# Patient Record
Sex: Female | Born: 1970 | Race: Black or African American | Hispanic: No | Marital: Single | Smoking: Former smoker
Health system: Southern US, Community
[De-identification: ages and names within clinical notes are randomized; demographics above are authoritative.]

## PROBLEM LIST (undated history)

## (undated) DIAGNOSIS — E079 Disorder of thyroid, unspecified: Secondary | ICD-10-CM

## (undated) HISTORY — PX: THYROIDECTOMY: SHX17

---

## 2004-01-21 ENCOUNTER — Emergency Department: Payer: Self-pay | Admitting: Internal Medicine

## 2004-02-01 ENCOUNTER — Other Ambulatory Visit: Payer: Self-pay

## 2004-02-01 ENCOUNTER — Emergency Department: Payer: Self-pay | Admitting: Unknown Physician Specialty

## 2005-09-09 ENCOUNTER — Emergency Department: Payer: Self-pay | Admitting: Emergency Medicine

## 2006-03-11 ENCOUNTER — Emergency Department: Payer: Self-pay | Admitting: Emergency Medicine

## 2006-06-20 ENCOUNTER — Emergency Department: Payer: Self-pay | Admitting: Emergency Medicine

## 2009-08-16 ENCOUNTER — Emergency Department: Payer: Self-pay | Admitting: Emergency Medicine

## 2010-12-19 ENCOUNTER — Emergency Department: Payer: Self-pay | Admitting: *Deleted

## 2011-07-22 ENCOUNTER — Emergency Department: Payer: Self-pay | Admitting: Emergency Medicine

## 2012-05-13 ENCOUNTER — Observation Stay: Payer: Self-pay | Admitting: Obstetrics and Gynecology

## 2012-05-13 LAB — FETAL FIBRONECTIN
Appearance: NORMAL
Fetal Fibronectin: NEGATIVE

## 2012-05-13 LAB — URINALYSIS, COMPLETE
Bilirubin,UR: NEGATIVE
Glucose,UR: NEGATIVE mg/dL (ref 0–75)
Leukocyte Esterase: NEGATIVE
Specific Gravity: 1.003 (ref 1.003–1.030)
Squamous Epithelial: 1

## 2012-05-31 ENCOUNTER — Observation Stay: Payer: Self-pay

## 2012-07-26 ENCOUNTER — Observation Stay: Payer: Self-pay | Admitting: Obstetrics and Gynecology

## 2012-07-30 ENCOUNTER — Observation Stay: Payer: Self-pay | Admitting: Obstetrics and Gynecology

## 2012-07-30 LAB — PIH PROFILE
BUN: 3 mg/dL — ABNORMAL LOW (ref 7–18)
Chloride: 107 mmol/L (ref 98–107)
EGFR (African American): >60
Glucose: 98 mg/dL (ref 65–99)
HGB: 10.6 g/dL — ABNORMAL LOW (ref 12.0–16.0)
MCHC: 34 g/dL (ref 32.0–36.0)
RBC: 3.83 10*6/uL (ref 3.80–5.20)
RDW: 14.2 % (ref 11.5–14.5)
Sodium: 141 mmol/L (ref 136–145)

## 2012-07-30 LAB — PROTEIN / CREATININE RATIO, URINE: Creatinine, Urine: 276.9 mg/dL — ABNORMAL HIGH (ref 30.0–125.0)

## 2012-08-15 ENCOUNTER — Inpatient Hospital Stay: Payer: Self-pay

## 2012-08-15 LAB — CBC WITH DIFFERENTIAL/PLATELET
Basophil #: 0 10*3/uL (ref 0.0–0.1)
Eosinophil #: 0 10*3/uL (ref 0.0–0.7)
Eosinophil %: 0.6 %
HGB: 11.4 g/dL — ABNORMAL LOW (ref 12.0–16.0)
MCH: 27.5 pg (ref 26.0–34.0)
MCHC: 33.5 g/dL (ref 32.0–36.0)
Monocyte #: 0.5 x10 3/mm (ref 0.2–0.9)
Neutrophil #: 3.7 10*3/uL (ref 1.4–6.5)
Platelet: 303 10*3/uL (ref 150–440)
WBC: 5.9 10*3/uL (ref 3.6–11.0)

## 2012-08-16 LAB — HEMATOCRIT: HCT: 33.1 % — ABNORMAL LOW (ref 35.0–47.0)

## 2012-08-17 LAB — PATHOLOGY REPORT

## 2012-10-14 ENCOUNTER — Ambulatory Visit: Payer: Self-pay

## 2013-02-20 ENCOUNTER — Emergency Department: Payer: Self-pay | Admitting: Emergency Medicine

## 2013-04-17 ENCOUNTER — Emergency Department: Payer: Self-pay | Admitting: Emergency Medicine

## 2013-06-15 ENCOUNTER — Ambulatory Visit: Payer: Self-pay

## 2014-05-26 NOTE — Op Note (Signed)
PATIENT NAME:  Alyssa Petersen, [name] MR#:  [id number] DATE OF BIRTH:  1970/05/27  DATE OF PROCEDURE:  08/16/2012  PREOPERATIVE DIAGNOSES:  1.  Postpartum, desires permanent sterilization.  2.  Multiparity.   POSTOPERATIVE DIAGNOSES:  1.  Postpartum, desires permanent sterilization.  2.  Multiparity.   PROCEDURE: Postpartum tubal ligation using [place] method.   ANESTHESIA: General.  SURGEON: Thomasene MohairStephen Adajah Cocking.   ESTIMATED BLOOD LOSS: 5 mL.   OPERATIVE FLUIDS: 1000 mL.   COMPLICATIONS: None.   FINDINGS: Normal-appearing fallopian tubes and ovaries bilaterally.   SPECIMENS: Portion of right and left fallopian tube.   INDICATIONS: The patient is a 44 year old multiparous female. She is a G4, P 3-1-0-5 who is postpartum day #1 from a vaginal delivery. Desires permanent sterilization. The risks/benefits of the procedure were discussed with the patient including risk of failure of 4 in every 1000. Procedures is performed at increased risk of ectopic pregnancy should a pregnancy occur.   PROCEDURE IN DETAIL: The patient was taken to the operating room where general anesthesia was administered and found to be adequate. A small longitudinal infraumbilical incision was made in the skin with a scalpel. The incision was carried down to the fascia until the peritoneum was identified and entered. Peritoneum was noted to be free of any adhesions, and the incision was then extended with the Mayo scissors.   The patient's right fallopian tube was then identified, brought to the incision, and grasped with a Babcock clamp. The tube was followed out to the fimbria. The Babcock clamp was used to grasp the tube approximately 4 cm from the cornual region. A 3 cm segment of tube was then ligated with a free-tie of plain gut and excised. Good hemostasis was noted, and the tube was returned to the abdomen. The left fallopian tube was then ligated and a 3 cm segment was excised in a similar fashion. Excellent  hemostasis was noted, and the tube was returned to the abdomen.   Peritoneum and the fascia were then closed in a single layer using a 3-0 Vicryl. Skin was closed in a subcuticular fashion using a 3-0 Vicryl. The skin closure was reinforced using Dermabond.   The patient tolerated the procedure well. Sponge, lap, and needle counts were correct x 2. The patient was taken to the recovery area in stable condition.    ____________________________ Conard NovakStephen D. Raekwan Spelman, MD sdj:dm D: 08/16/2012 11:18:20 ET T: 08/16/2012 11:51:51 ET JOB#: [id number]  cc: Conard NovakStephen D. Saniya Tranchina, MD, <Dictator> Conard NovakSTEPHEN D Genesi Stefanko MD ELECTRONICALLY SIGNED 08/31/2012 12:51

## 2014-06-13 NOTE — H&P (Signed)
L&D Evaluation:  History:  HPI 44 yo G4P2104 37 week 3 days gestation reports dizzyness this AM.  Has not had anything to eat or drink today.  BP in clinic were normal, her urine dip showed +1 protein.  She had a mild headache with the dizzyness now resolved.  Also some nausea no emesis.  No prior BP concerns this pregnancy.  + FM, no LOF, no ctx.  Echogenic foci on us, BMI 36, on levothyroxine for thyroid removal, has fibroid in lower uterine area. currently no bleeding   Presents with abdominal pain   Patient's Medical History No Chronic Illness   Patient's Surgical History none   Medications Pre Natal Vitamins  levothyroxine   Allergies NKDA   Social History none   Family History Non-Contributory   ROS:  General normal   HEENT normal   CNS normal   GI normal   GU pain   Resp normal   CV normal   Renal normal   MS normal   Exam:  Vital Signs stable   Urine Protein not completed   General no apparent distress   Mental Status clear   Chest clear   Heart normal sinus rhythm   Abdomen gravid, non-tender   Estimated Fetal Weight Average for gestational age   Back no CVAT   Edema no edema   Reflexes 1+   Mebranes Intact   FHT normal rate with no decels   Ucx absent   Impression:  Impression Evaluation for pre-eclampsia   Plan:  Plan EFM/NST   Comments - PreE labs negative - Protein/Cr ratio of 270 - Normotensive throughout admission - reactive category I tracing 130, mod, + accels, no decels - Repeat BP check on 08/02/12 at 08:30 with myself in [place]   Follow Up Appointment already scheduled   Electronic Signatures: Lorrene ReidStaebler, Alka Falwell M (MD)  (Signed 27-Jun-14 12:40)  Authored: L&D Evaluation   Last Updated: 27-Jun-14 12:40 by Lorrene ReidStaebler, Talma Aguillard M (MD)

## 2014-06-13 NOTE — H&P (Signed)
L&D Evaluation:  History Expanded:  HPI 44 yo G4P2104 26 week 2d pregnancy previuos twins years ago now with lower abdominal pain started today and has had an increase in discharge today, it appears to be yellowish. no cramping or ctx that she knows of records unavailable at thiis time.she  has thyroid disease on 175 mcg of levo and has been changed from 150 but now is hyperthyroid as of 3/20, echogenic foci on us, BMI 36, on levothyroxine for thyroid removal, has fibroid in lower uterine area. currently no bleeding   Gravida 4   Term 2   PreTerm 1   Abortion 0   Living 4   Maternal HIV Negative   Maternal Syphilis Ab Nonreactive   Maternal Varicella Immune   Rubella Results (Maternal) immune   Maternal T-Dap Nonimmune   [facility]   Presents with abdominal pain   Patient's Medical History No Chronic Illness    Medications Pre Natal Vitamins  levothyroxine   Allergies NKDA   Social History none   Family History Non-Contributory   Current Prenatal Course Notable For obesity and hypothyroid disease    ROS:  General normal   HEENT normal   CNS normal   GI normal   GU pain   Resp normal   CV normal   Renal normal   MS normal   Exam:  Vital Signs stable   Urine Protein not completed   General no apparent distress   Mental Status clear   Chest clear   Heart normal sinus rhythm   Abdomen gravid, non-tender   Estimated Fetal Weight Average for gestational age   Back no CVAT   Edema no edema   Reflexes 1+   Pelvic no external lesions   Mebranes Intact   FHT normal rate with no decels   Ucx absent   Plan:  Plan UA, EFM/NST, monitor contractions and for cervical change   Comments ffn   Follow Up Appointment need to schedule   Electronic Signatures: Adria DevonKlett, Jazmyne Beauchesne (MD)  (Signed 10-Apr-14 15:11)  Authored: L&D Evaluation   Last Updated: 10-Apr-14 15:11 by Adria DevonKlett, Demetrion Wesby (MD)

## 2014-06-13 NOTE — H&P (Signed)
L&D Evaluation:  History Expanded:  HPI 44 yo [id number], EDD of 08/17/12 per LMP & early US. Presents at 39.5 weeks with c/o SROM at 0400 this am. Pt has occasional mild/moderate contractions, some spotting. +FM. Had been c/o dizziness for several weeks, reports this has resolved since SROM.  [facility], AMA with normal [place], Echogenic foci on us at 19 weeks (not seen on f/u), BMI 36, Hypothyroidism (s/p thyroidectomy) and Hypoparathyroidism, on levothyroxine & Calcium, anterior fibroid, elevated early 1 hour, normal 3 hour x 2.   Blood Type (Maternal) A positive   Group B Strep Results Maternal (Result >5wks must be treated as unknown) positive   Maternal HIV Negative   Maternal Syphilis Ab Nonreactive   Maternal Varicella Immune   Rubella Results (Maternal) immune   Presents with leaking fluid   Patient's Medical History Hypothyroidism, hypoparathyroidism   Patient's Surgical History thyroidectomy, breast lumpectomy   Medications Pre Natal Vitamins  levothyroxine 137 mcg, calcium   Allergies Ibuprofen & shrimp (states reaction only happens when taken together)   Social History none   Family History Non-Contributory   ROS:  ROS see HPI   Exam:  Vital Signs 140/90 or below   Urine Protein not completed   General no apparent distress   Mental Status clear   Chest clear   Heart no murmur/gallop/rubs   Abdomen gravid, tender with contractions   Estimated Fetal Weight Average for gestational age, 7#   Back no CVAT   Edema no edema   Reflexes 1+   Pelvic no external lesions, 2/90/-2 to -3   Mebranes Ruptured   Description clear   FHT normal rate with no decels, category 1   Ucx irregular   Skin dry   Impression:  Impression SROM   Plan:  Comments Begin pitocin once antibiotics have been in x 4 hours. Discussed risks & benefits of labor augmentation, pt in agreement with plan.   Electronic  Signatures: Vella KohlerBrothers, Jema Deegan K (CNM)  (Signed 13-Jul-14 10:36)  Authored: L&D Evaluation   Last Updated: 13-Jul-14 10:36 by Vella KohlerBrothers, Vila Dory K (CNM)

## 2014-09-06 IMAGING — US US BREAST*R* LIMITED INC AXILLA
1 series · 5 of 5 positions shown · non-contrast
Comparison: 10/14/2012 mammogram and ultrasound

CLINICAL DATA: 42-year-old female -followup right breast mass.

EXAM:
ULTRASOUND OF THE RIGHT BREAST

[Series 1: us breast*right* limited inc axilla · 0.08mm/px · 5 of 5 slices shown]
[im 1/5]
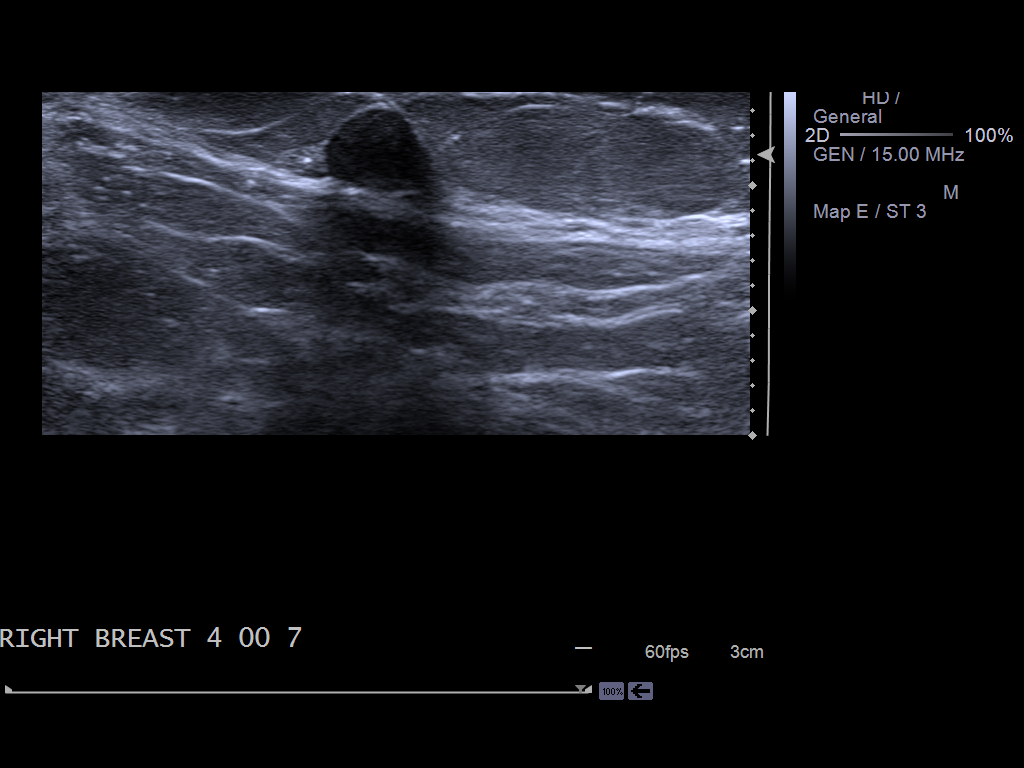
[im 2/5]
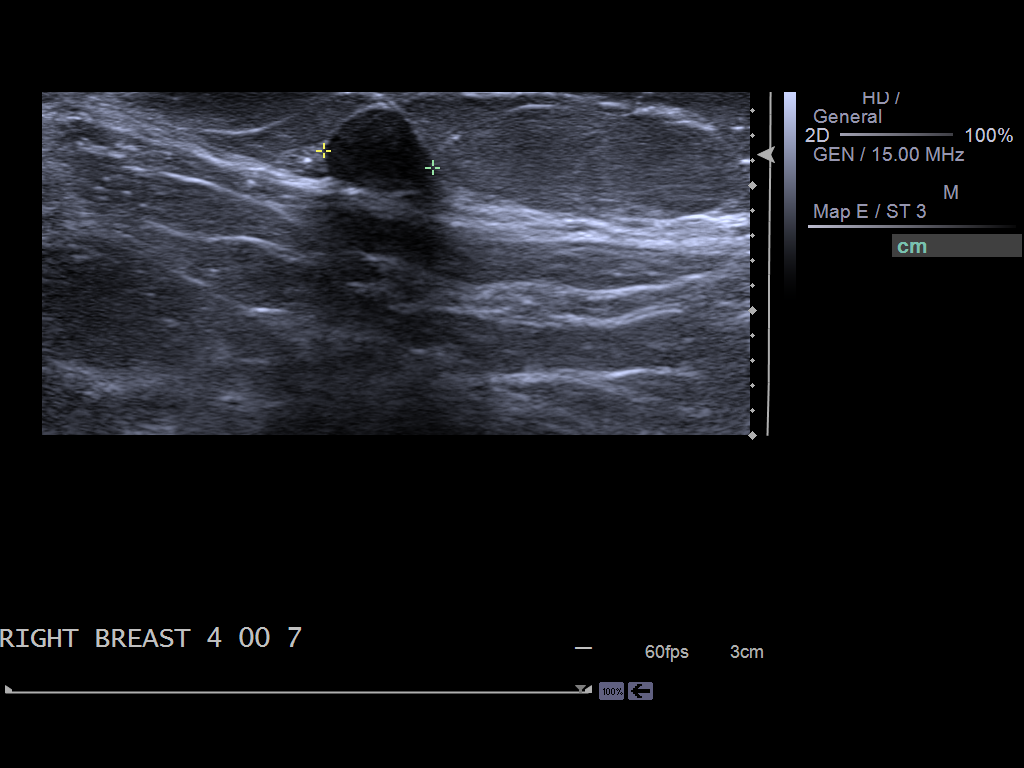
[im 3/5]
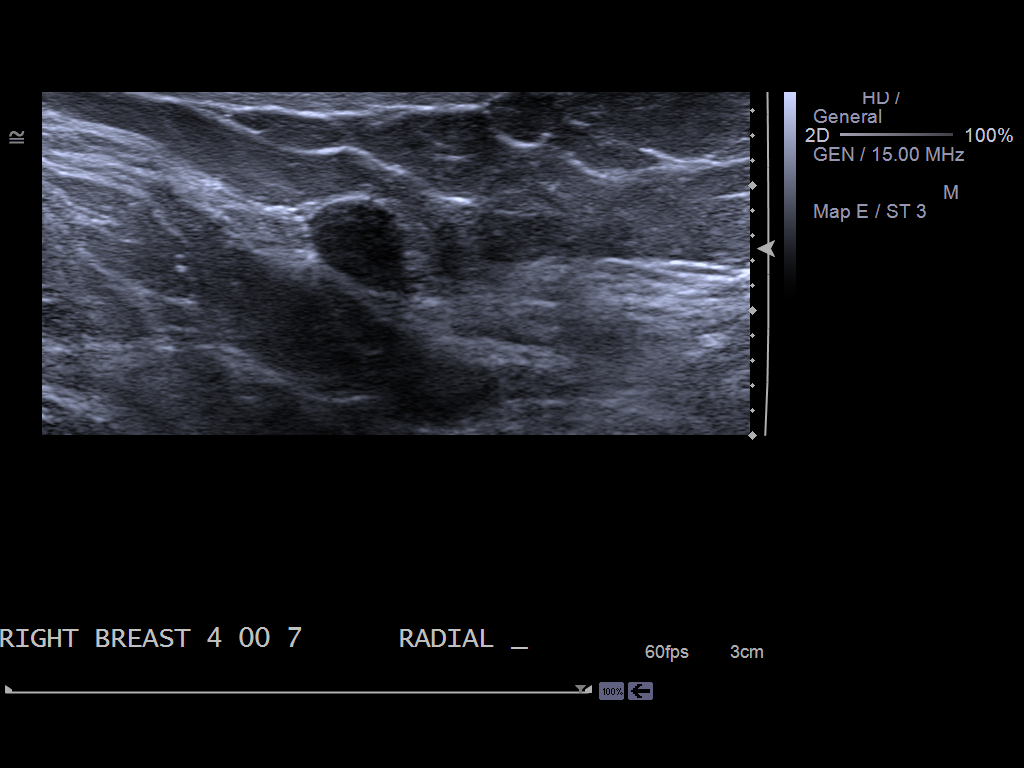
[im 4/5]
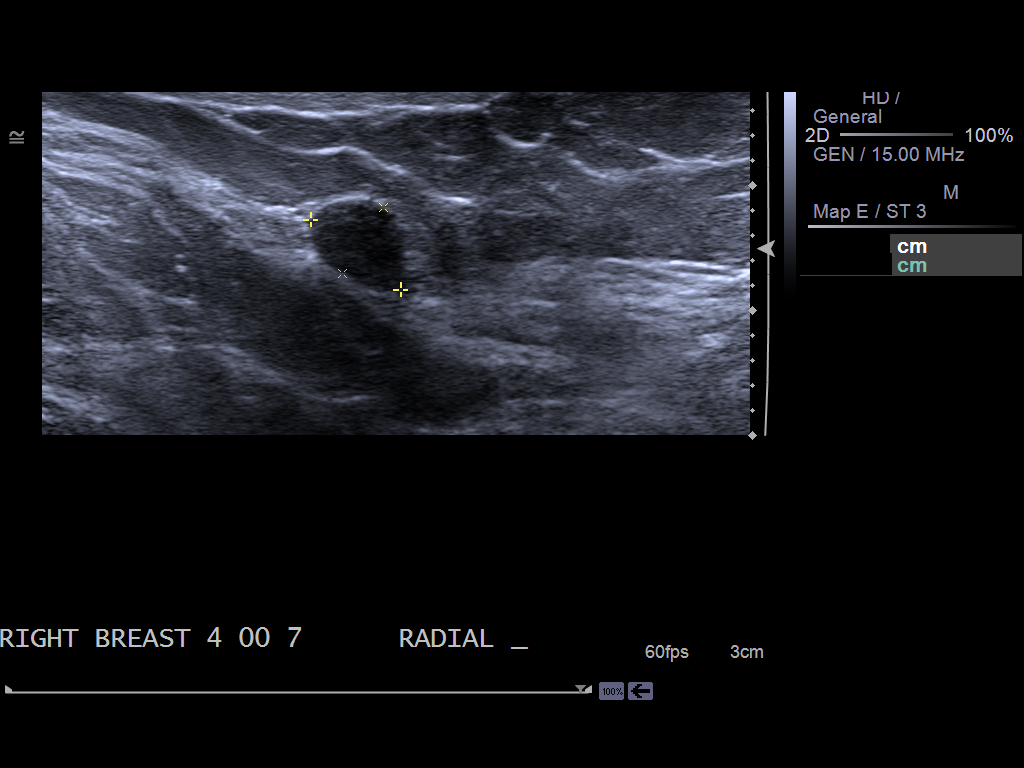
[im 5/5]
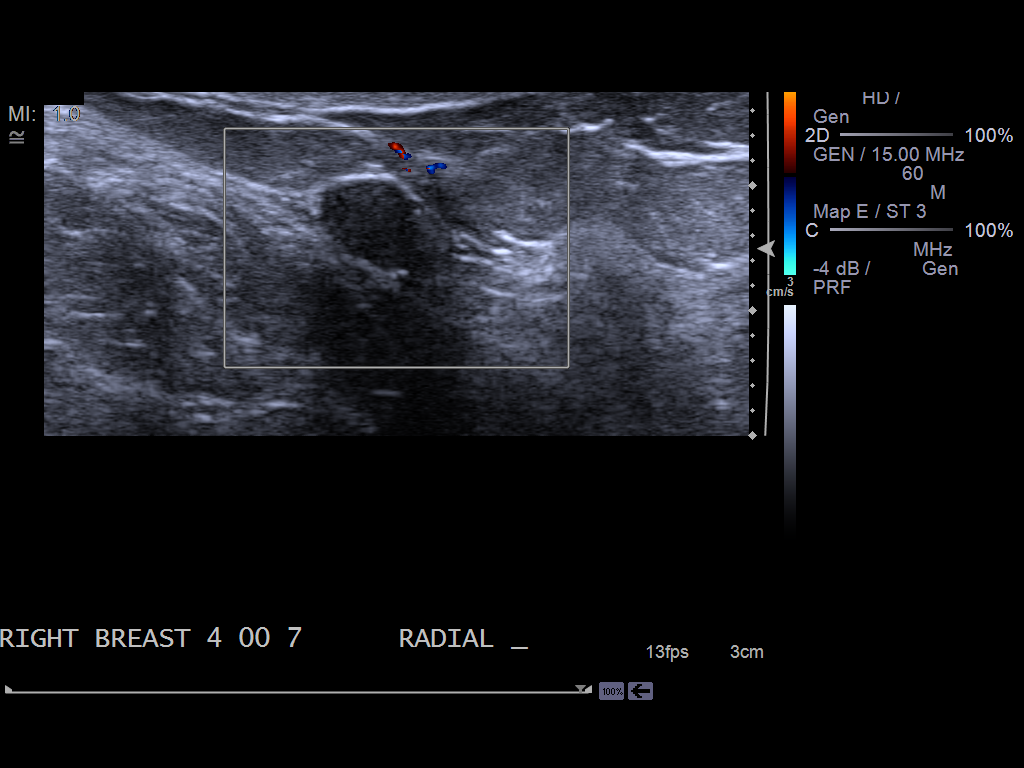

[5 of 5 positions shown; findings below may reference images not displayed]

FINDINGS: On physical exam,no palpable abnormalities are identified within the
lower inner right breast..

Ultrasound is performed, showing a stable 6 x 9 x 9 mm circumscribed
oval very hypoechoic parallel mass at the 4 o'clock position of the
right breast 7 cm from the nipple. This probably represents a
fibroadenoma or possibly a complicated cyst.
IMPRESSION: Stable likely benign mass in the lower inner right breast -probably
a fibroadenoma. Six-month followup is recommended to ensure 1 year
stability.

RECOMMENDATION:
Bilateral diagnostic mammograms and right breast ultrasound in 6
months to resume annual mammogram schedule and reassess likely
benign right breast finding.

I have discussed the findings and recommendations with the patient.
Results were also provided in writing at the conclusion of the
visit. If applicable, a reminder letter will be sent to the patient
regarding the next appointment.

BI-RADS CATEGORY  3: Probably benign finding(s) - short interval
follow-up suggested.

## 2014-11-15 ENCOUNTER — Ambulatory Visit
Admission: EM | Admit: 2014-11-15 | Discharge: 2014-11-15 | Disposition: A | Discharge status: Home / Self Care | Payer: 59 | Attending: Emergency Medicine | Admitting: Emergency Medicine

## 2014-11-15 ENCOUNTER — Encounter: Payer: Self-pay | Admitting: Emergency Medicine

## 2014-11-15 ENCOUNTER — Ambulatory Visit
Admit: 2014-11-15 | Discharge: 2014-11-15 | Disposition: A | Discharge status: Home / Self Care | Payer: 59 | Attending: Family Medicine | Admitting: Family Medicine

## 2014-11-15 DIAGNOSIS — Z87891 Personal history of nicotine dependence: Secondary | ICD-10-CM | POA: Insufficient documentation

## 2014-11-15 DIAGNOSIS — R51 Headache: Secondary | ICD-10-CM

## 2014-11-15 DIAGNOSIS — R519 Headache, unspecified: Secondary | ICD-10-CM

## 2014-11-15 HISTORY — DX: Disorder of thyroid, unspecified: E07.9

## 2014-11-15 MED ORDER — KETOROLAC TROMETHAMINE 60 MG/2ML IM SOLN
60.0000 mg | Freq: Once | INTRAMUSCULAR | Status: AC
  Administered 2014-11-15: 60 mg via INTRAMUSCULAR

## 2014-11-15 MED ORDER — PROMETHAZINE HCL 25 MG/ML IJ SOLN
25.0000 mg | Freq: Once | INTRAMUSCULAR | Status: AC
Start: 2014-11-15 — End: 2014-11-15
  Administered 2014-11-15: 25 mg via INTRAMUSCULAR

## 2014-11-15 NOTE — ED Notes (Signed)
Patient c/o HA that started last Monday.  Patient denies N/V.  Patient denies light or sound sensitivity.

## 2014-11-15 NOTE — Discharge Instructions (Signed)
Rest. Avoid stress as able. Drink plenty of water. Eat and drink regularly. Monitor blood pressure at home and keep journal as discussed. Take this journal with you at your doctors appointment.   Follow up with your primary care physician next week. Follow up closely.   Return to Urgent care for new or worsening concerns.

## 2014-11-15 NOTE — ED Provider Notes (Signed)
[facility] Emergency Department Provider Note  ____________________________________________  Time seen: Approximately 1:42 PM  I have reviewed the triage vital signs and the nursing notes.   HISTORY  Chief Complaint Headache    HPI Alyssa Petersen is a 44 y.o. female presents with spouse at bedside for the complaints of headache. Patient reports that over the last week she has had increased headaches. Patient reports that she has headaches daily. Describes the headache as a band across her forehead that is aching. States headaches very in timing. States headaches sometimes are present first thing in the morning but also appear later in the day. States that she does not have a headache every morning. Patient reports the headache does go away with over-the-counter medication. States the headache will fully resolve with over-the-counter Excedrin Migraine, but states the headache tends to come back the next day. States the Excedrin does tend to take the headache away for the rest of the day. States that she has taken Excedrin for a total of 6 pills in the last week. States has not taken over-the-counter medication every day.  Patient reports current headache is 5 out of 10 and described as a band across her head that feels like squeezing tension. States occasionally she feels tightness in her shoulder muscles. Denies pain radiation. Denies neck pain or back pain. Denies fall or injury. Denies vision changes, dizziness, numbness or tingling sensations, weakness, chest pain, shortness of breath, confusion, abdominal pain or other changes. Reports continues to eat and drink well.  Patient does report that over the last 2-3 weeks she has had increased job responsibilities and stress at work. Also reports that she is working longer hours and is having to continue to work from home once she gets home. 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 Patient does report that she has to wear reading glasses when looking at computer screens as well as reading states that she did have her eyes examined by her eye doctor a few weeks ago as a normal follow-up and her vision was normal. Denies fall or injury.  Patient does also report that she has a a 19-year-old child that frequently wakes her up at night and patient reports that between the 29-year-old child and having to work also from home she is not having a lot of sleep. States that she normally goes to bed at approximately midnight and gets up at 6 AM but states her sleep is interrupted by her 29-year-old. States that she has no difficulty falling asleep but has just been too busy to get more hours of sleep.  Patient also reports that she frequently monitors her blood pressure at home with her spouse's monitor and reports blood pressure is usually normal and states normal is 120-130/70s. States similar reading this past week when checked while headache was present. Also reports that she sees her primary care doctor every 6 months to one year and her blood pressure is usually normal.  Denies recent changes in her medications.    Past Medical History  Diagnosis Date  . Thyroid disease     There are no active problems to display for this patient.   Past Surgical History  Procedure Laterality Date  . Thyroidectomy      Current Outpatient Rx  Name  Route  Sig  Dispense  Refill  . levothyroxine (SYNTHROID, LEVOTHROID) 175 MCG tablet   Oral   Take 175 mcg by  mouth daily before breakfast.           Allergies Shellfish allergy  History reviewed. No pertinent family history.  Social History Social History  Substance Use Topics  . Smoking status: Former Games developer  . Smokeless tobacco: None  . Alcohol Use: No   PCP: [facility].   Family History: Mother: CVA Father: HTN    Review of Systems Constitutional: No fever/chills Eyes: No visual changes. ENT: No  sore throat. Cardiovascular: Denies chest pain. Respiratory: Denies shortness of breath. Gastrointestinal: No abdominal pain.  No nausea, no vomiting.  No diarrhea.  No constipation. Genitourinary: Negative for dysuria. Musculoskeletal: Negative for back pain. Skin: Negative for rash. Neurological: positive for headaches. Negative for focal weakness or numbness.  10-point ROS otherwise negative.  ____________________________________________   PHYSICAL EXAM:  VITAL SIGNS: ED Triage Vitals  Enc Vitals Group     BP 11/15/14 1302 156/80 mmHg     Pulse Rate 11/15/14 1302 57     Resp 11/15/14 1302 16     Temp 11/15/14 1302 97.7 F (36.5 C)     Temp Source 11/15/14 1302 Tympanic     SpO2 11/15/14 1302 100 %     Weight 11/15/14 1302 212 lb (96.163 kg)     Height 11/15/14 1302  (1.676 m)     Head Cir --      Peak Flow --      Pain Score 11/15/14 1305 6     Pain Loc --      Pain Edu? --      Excl. in GC? --    Today's Vitals   11/15/14 1302 11/15/14 1305 11/15/14 1355  BP: 156/80  146/71  Pulse: 57  74  Temp: 97.7 F (36.5 C)    TempSrc: Tympanic    Resp: 16    Height:  (1.676 m)    Weight: 212 lb (96.163 kg)    SpO2: 100%    PainSc:  6     Constitutional: Alert and oriented. Well appearing and in no acute distress. Eyes: Conjunctivae are normal. PERRL. EOMI. Anterior chambers with limited bedside exam normal, no papilledema visualized.  Head: Atraumatic.no tenderness over temporal arteries. No sinus TTP. No tenderness to palpation.   Ears: no erythema, normal TMs bilaterally.   Nose: No congestion/rhinnorhea.  Mouth/Throat: Mucous membranes are moist.  Oropharynx non-erythematous. Neck: No stridor.  No cervical spine tenderness to palpation. No carotid bruit.  Hematological/Lymphatic/Immunilogical: No cervical lymphadenopathy. Cardiovascular: Normal rate, regular rhythm. Grossly normal heart sounds.  Good peripheral circulation. Respiratory: Normal  respiratory effort.  No retractions. Lungs CTAB. Gastrointestinal: Soft and nontender. No distention. Normal Bowel sounds.  No abdominal bruits. No CVA tenderness. Musculoskeletal: No lower or upper extremity tenderness nor edema.  No joint effusions. Bilateral pedal pulses equal and easily palpated. No cervical, thoracic or lumbar TTP.  Neurologic:  Normal speech and language. No gross focal neurologic deficits are appreciated. No gait instability. No ataxia, normal finger to nose. Negative Romberg. Negative brudzkinski's and negative kernig's. No meningismus.   Skin:  Skin is warm, dry and intact. No rash noted. Psychiatric: Mood and affect are normal. Speech and behavior are normal.   ___________________________________________   LABS (all labs ordered are listed, but only abnormal results are displayed)  Labs Reviewed - No data to display  INITIAL IMPRESSION / ASSESSMENT AND PLAN / ED COURSE  Pertinent labs & imaging results that were available during my care of the patient were reviewed by me  and considered in my medical decision making (see chart for details).  Very well-appearing patient. No acute distress. Presents with spouse at the bedside for the complaints of intermittent headache 1 week. Reports having daily headaches. Reports that headaches do resolve over-the-counter Excedrin Migraine. Patient also reports that along the same timeframe of increased frequency of headaches she reports she has had more stress and more job responsibilities at work. Patient reports that she does have a history of tension headaches and this feels similar however feels stronger and more frequent. Very well-appearing patient. No neurological deficits.  Headaches do seem to be correlated with job responsibilities as well as busy schedule. Patient's blood pressure is elevated today. However patient reports that she monitors this at home and blood pressure is usually normal. Long conversation with patient  in regards to monitoring blood pressure at home and keeping blood pressure journal. Patient [facility] and take the journal with her for further evaluation. Patient states that she feels her blood pressure is elevated today as she is in pain. Denies chest pain, shortness of breath, vision changes, weakness or dizziness.  Will treat headache at this time with IM Toradol and IM Phenergan. Will obtain CT head to rule out any mass effect. Encouraged patient to rest and try to decrease and avoid stressors is able. Also encouraged patient to try to set aside enough time daily to get enough sleep. Also encouraged supportive treatments including fluids, regular diet and rest. Patient is to follow-up very closely with her primary care physician. Follow-up next week in regards to headache as well as blood pressure journal, however discussed if headaches  continue she needs to be seen this week. Discussed follow up with Primary care physician this week. Discussed follow up and return parameters including no resolution or any worsening concerns. Patient verbalized understanding and agreed to plan.   ____________________________________________   FINAL CLINICAL IMPRESSION(S) / ED DIAGNOSES  Final diagnoses:  Acute nonintractable headache, unspecified headache type     Renford DillsLindsey Terek Bee, NP 11/15/14 1542     ADDENDUM 11/15/2014  1545  Spoke with patient regarding CT head results. CT head study within normal limits. Patient reports at this time her current pain is 2 out of 10. States headache is almost fully resolved. States that she is feeling much better after IM Toradol and Phenergan. Discussed follow up with Primary care physician week. Discussed follow up and return parameters including no resolution or any worsening concerns. Patient verbalized understanding and agreed to plan.   CT HEAD WITHOUT CONTRAST  TECHNIQUE: Contiguous axial  images were obtained from the base of the skull through the vertex without intravenous contrast.  COMPARISON: August 16, 2009  FINDINGS: The ventricles are normal in size and configuration. There is no intracranial mass, hemorrhage, extra-axial fluid collection, or midline shift. The gray-white compartments are normal. No acute infarct evident. The bony calvarium appears intact. The mastoid air cells are clear.  IMPRESSION: Study within normal limits.   Electronically Signed  By: Bretta BangWilliam Woodruff III M.D.  On: 11/15/2014 15:28   Renford DillsLindsey Archit Leger, NP 11/15/14 [phone]

## 2016-02-06 IMAGING — CT CT HEAD W/O CM
1 series · 16 of 29 positions shown, 20 images · non-contrast
Comparison: August 16, 2009

CLINICAL DATA: One week history of headaches.

EXAM:
CT HEAD WITHOUT CONTRAST
TECHNIQUE: Contiguous axial images were obtained from the base of the skull
through the vertex without intravenous contrast.

[Series 2: soft tissue · axial · 0.41mm/px · z∈[-118,+12]mm · 16 of 29 slices shown, 20 images]
[im 2/29  brain]
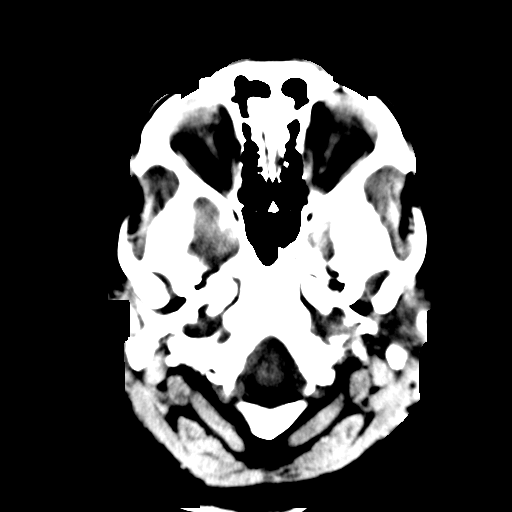
[im 2/29  bone]
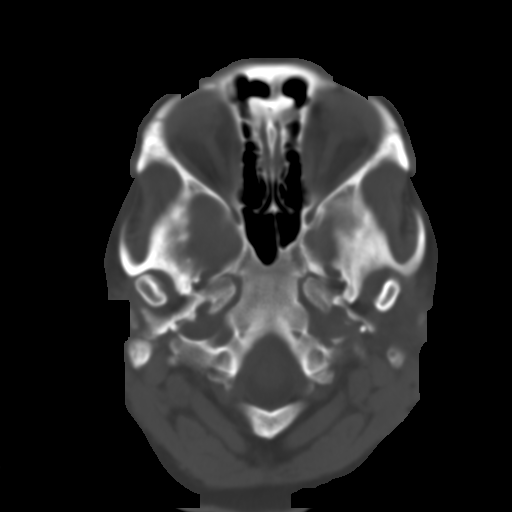
[im 4/29  brain]
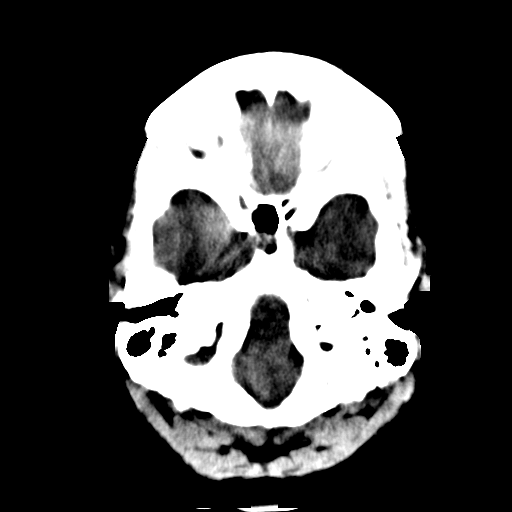
[im 6/29  brain]
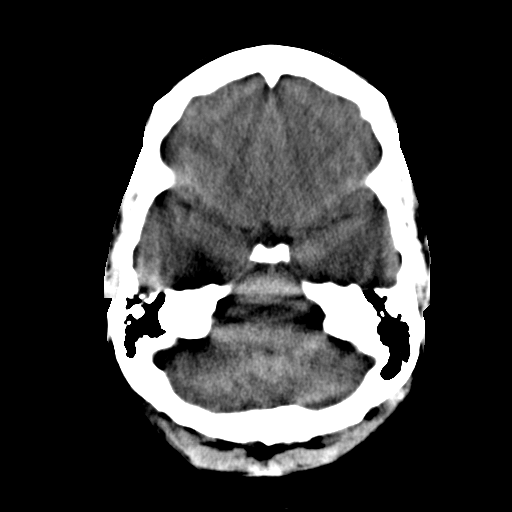
[im 7/29  brain]
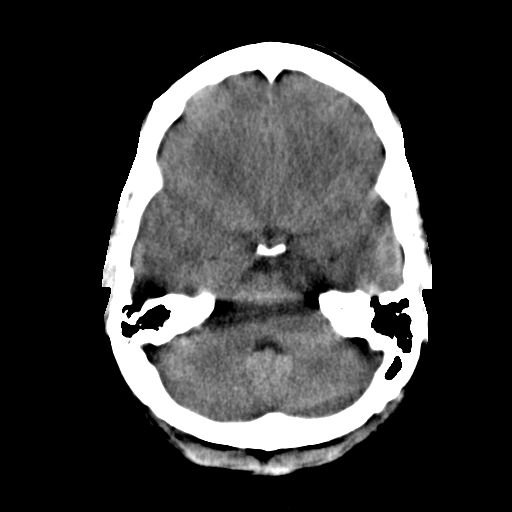
[im 9/29  brain]
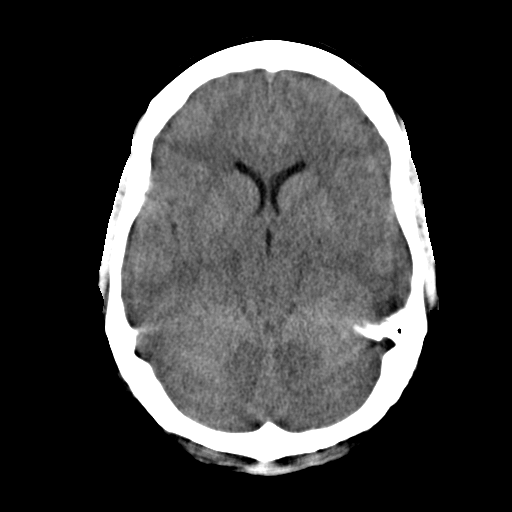
[im 9/29  bone]
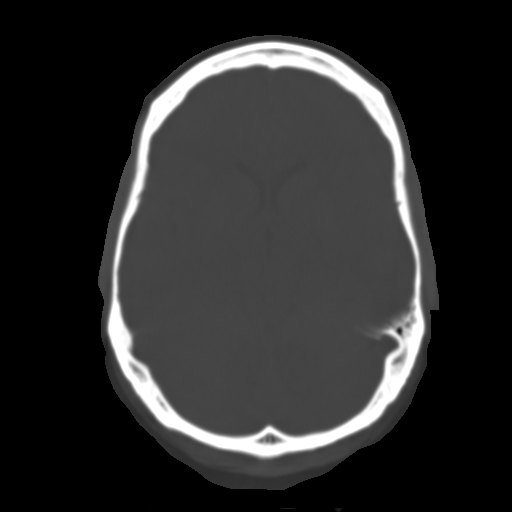
[im 11/29  brain]
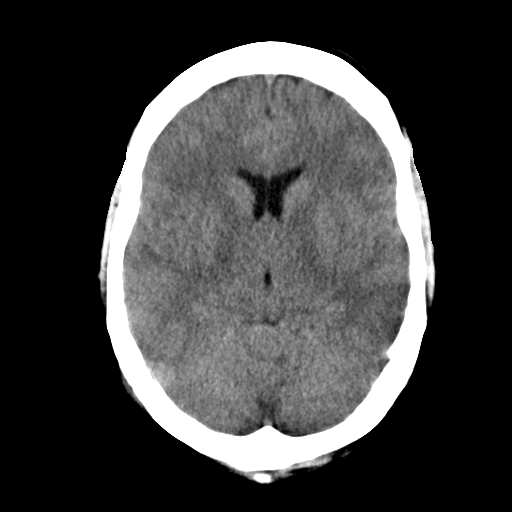
[im 12/29  brain]
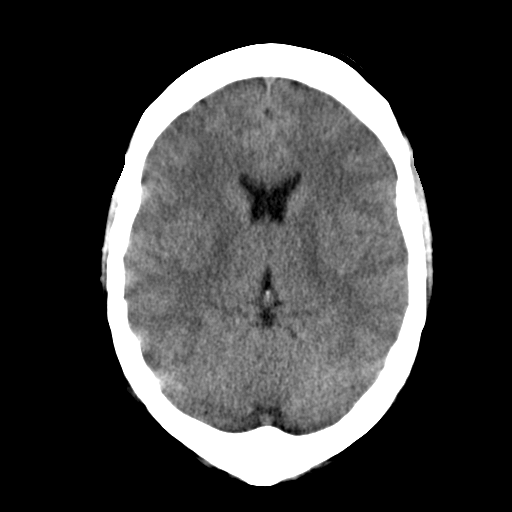
[im 14/29  brain]
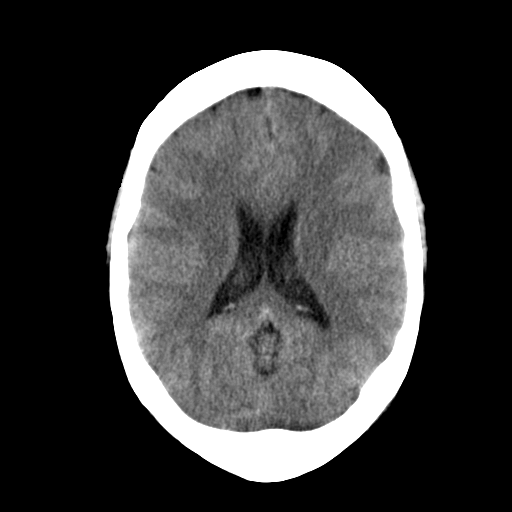
[im 16/29  brain]
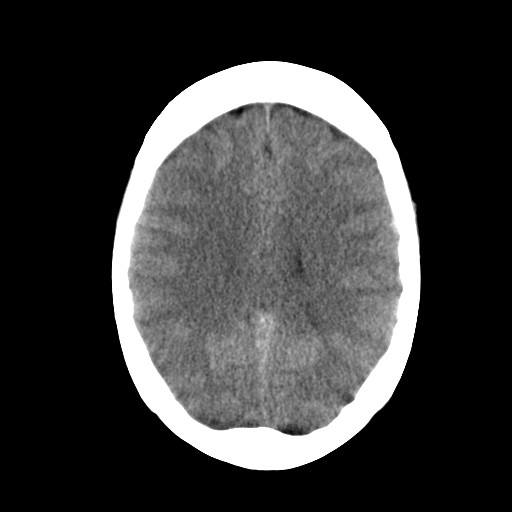
[im 16/29  bone]
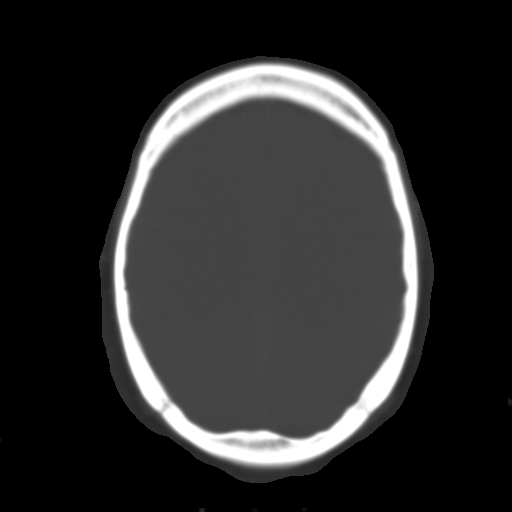
[im 18/29  brain]
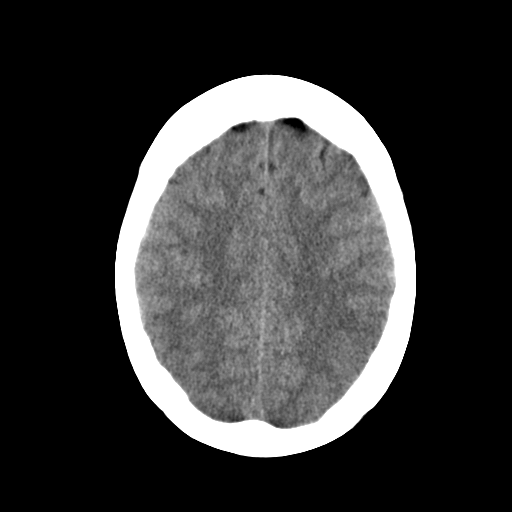
[im 19/29  brain]
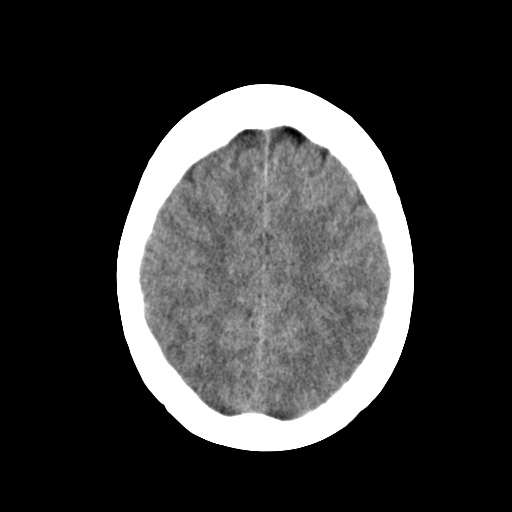
[im 21/29  brain]
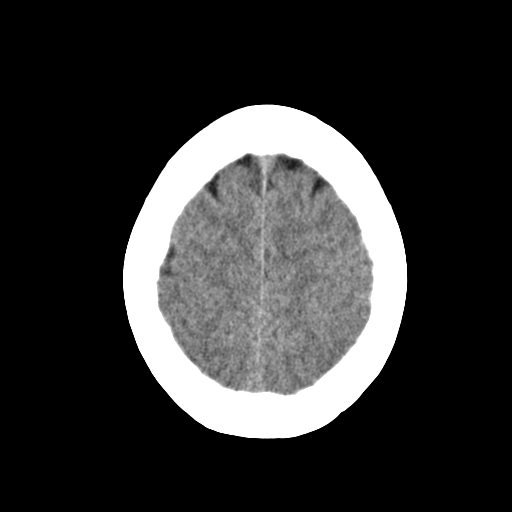
[im 23/29  brain]
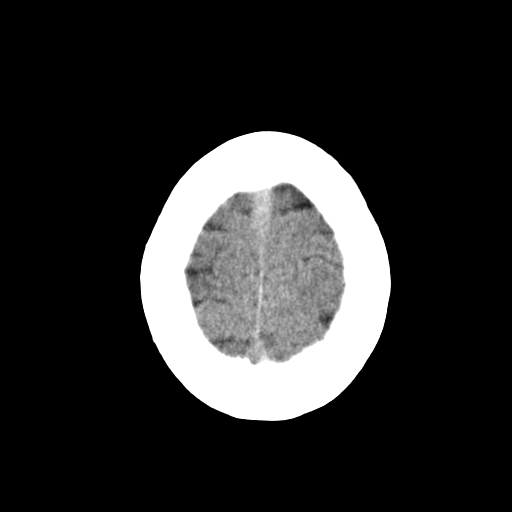
[im 23/29  bone]
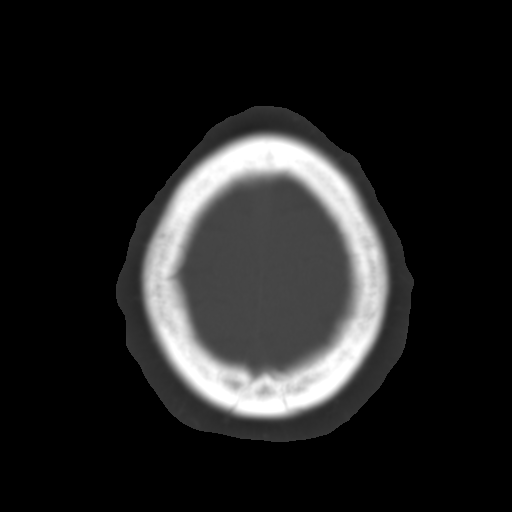
[im 24/29  brain]
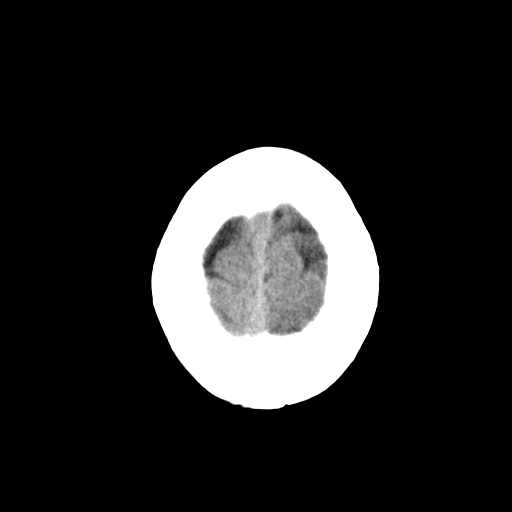
[im 26/29  brain]
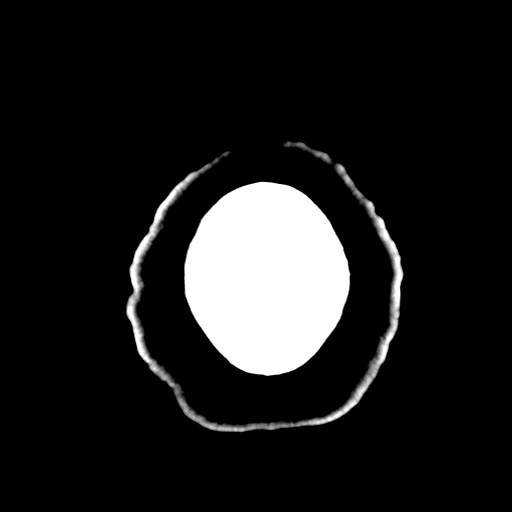
[im 28/29  brain]
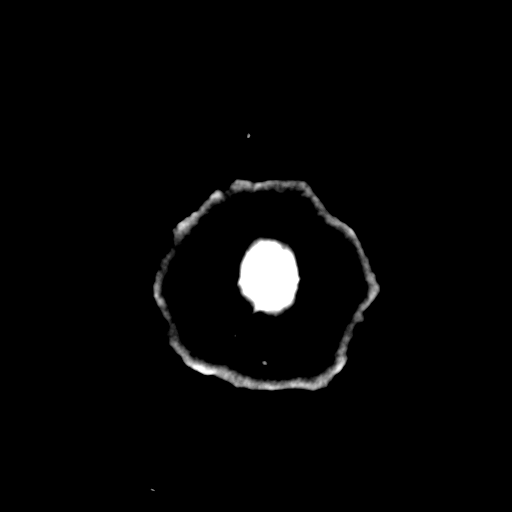

[16 of 29 positions shown; findings below may reference images not displayed]

FINDINGS: The ventricles are normal in size and configuration. There is no
intracranial mass, hemorrhage, extra-axial fluid collection, or
midline shift. The gray-white compartments are normal. No acute
infarct evident. The bony calvarium appears intact. The mastoid air
cells are clear.
IMPRESSION: Study within normal limits.
